# Patient Record
Sex: Male | Born: 1955 | Race: White | Hispanic: No | Marital: Married | Smoking: Former smoker
Health system: Southern US, Community
[De-identification: ages and names within clinical notes are randomized; demographics above are authoritative.]

## PROBLEM LIST (undated history)

## (undated) DIAGNOSIS — E119 Type 2 diabetes mellitus without complications: Secondary | ICD-10-CM

## (undated) DIAGNOSIS — K219 Gastro-esophageal reflux disease without esophagitis: Secondary | ICD-10-CM

## (undated) DIAGNOSIS — N289 Disorder of kidney and ureter, unspecified: Secondary | ICD-10-CM

## (undated) DIAGNOSIS — E7800 Pure hypercholesterolemia, unspecified: Secondary | ICD-10-CM

## (undated) DIAGNOSIS — I10 Essential (primary) hypertension: Secondary | ICD-10-CM

## (undated) HISTORY — PX: ANTERIOR CERVICAL DISCECTOMY: SHX1160

## (undated) HISTORY — PX: SURGERY SCROTAL / TESTICULAR: SUR1316

## (undated) HISTORY — PX: OTHER SURGICAL HISTORY: SHX169

## (undated) HISTORY — PX: RADICAL ORCHIECTOMY: SHX2285

## (undated) HISTORY — PX: FLEXOR TENDON OF FOREARM / WRIST REPAIR: SHX1650

## (undated) HISTORY — PX: NECK SURGERY: SHX720

## (undated) HISTORY — PX: CYST EXCISION: SHX5701

## (undated) HISTORY — PX: LITHOTRIPSY: SUR834

---

## 2003-02-16 ENCOUNTER — Encounter: Payer: Self-pay | Admitting: Neurosurgery

## 2003-02-20 ENCOUNTER — Encounter: Payer: Self-pay | Admitting: Neurosurgery

## 2003-02-20 ENCOUNTER — Observation Stay (HOSPITAL_COMMUNITY): Admission: RE | Admit: 2003-02-20 | Discharge: 2003-02-21 | Payer: Self-pay | Admitting: Neurosurgery

## 2014-04-01 DIAGNOSIS — I10 Essential (primary) hypertension: Secondary | ICD-10-CM | POA: Insufficient documentation

## 2015-02-25 ENCOUNTER — Encounter: Payer: Self-pay | Admitting: Internal Medicine

## 2015-02-25 NOTE — Telephone Encounter (Signed)
This encounter was created in error - please disregard.

## 2015-03-19 DIAGNOSIS — E1165 Type 2 diabetes mellitus with hyperglycemia: Secondary | ICD-10-CM | POA: Insufficient documentation

## 2018-06-27 ENCOUNTER — Encounter (HOSPITAL_COMMUNITY): Payer: Self-pay

## 2018-06-27 ENCOUNTER — Emergency Department (HOSPITAL_COMMUNITY)
Admission: EM | Admit: 2018-06-27 | Discharge: 2018-06-27 | Disposition: A | Discharge status: Home / Self Care | Payer: 59 | Attending: Emergency Medicine | Admitting: Emergency Medicine

## 2018-06-27 ENCOUNTER — Emergency Department (HOSPITAL_COMMUNITY): Payer: 59

## 2018-06-27 ENCOUNTER — Other Ambulatory Visit: Payer: Self-pay

## 2018-06-27 DIAGNOSIS — I10 Essential (primary) hypertension: Secondary | ICD-10-CM | POA: Diagnosis not present

## 2018-06-27 DIAGNOSIS — Z87891 Personal history of nicotine dependence: Secondary | ICD-10-CM | POA: Insufficient documentation

## 2018-06-27 DIAGNOSIS — R55 Syncope and collapse: Secondary | ICD-10-CM | POA: Insufficient documentation

## 2018-06-27 DIAGNOSIS — Z79899 Other long term (current) drug therapy: Secondary | ICD-10-CM | POA: Insufficient documentation

## 2018-06-27 DIAGNOSIS — E119 Type 2 diabetes mellitus without complications: Secondary | ICD-10-CM | POA: Diagnosis not present

## 2018-06-27 DIAGNOSIS — Z7984 Long term (current) use of oral hypoglycemic drugs: Secondary | ICD-10-CM | POA: Diagnosis not present

## 2018-06-27 DIAGNOSIS — Z7982 Long term (current) use of aspirin: Secondary | ICD-10-CM | POA: Diagnosis not present

## 2018-06-27 HISTORY — DX: Essential (primary) hypertension: I10

## 2018-06-27 HISTORY — DX: Gastro-esophageal reflux disease without esophagitis: K21.9

## 2018-06-27 HISTORY — DX: Pure hypercholesterolemia, unspecified: E78.00

## 2018-06-27 HISTORY — DX: Disorder of kidney and ureter, unspecified: N28.9

## 2018-06-27 HISTORY — DX: Type 2 diabetes mellitus without complications: E11.9

## 2018-06-27 LAB — COMPREHENSIVE METABOLIC PANEL
ALBUMIN: 4.6 g/dL (ref 3.5–5.0)
ALT: 28 U/L (ref 0–44)
AST: 33 U/L (ref 15–41)
Alkaline Phosphatase: 61 U/L (ref 38–126)
Anion gap: 16 — ABNORMAL HIGH (ref 5–15)
BUN: 23 mg/dL (ref 8–23)
CHLORIDE: 102 mmol/L (ref 98–111)
CO2: 25 mmol/L (ref 22–32)
Calcium: 10.1 mg/dL (ref 8.9–10.3)
Creatinine, Ser: 2.67 mg/dL — ABNORMAL HIGH (ref 0.61–1.24)
GFR calc Af Amer: 28 mL/min — ABNORMAL LOW (ref >60)
GFR calc non Af Amer: 24 mL/min — ABNORMAL LOW (ref >60)
GLUCOSE: 174 mg/dL — AB (ref 70–99)
Potassium: 3.4 mmol/L — ABNORMAL LOW (ref 3.5–5.1)
SODIUM: 143 mmol/L (ref 135–145)
Total Bilirubin: 1.1 mg/dL (ref 0.3–1.2)
Total Protein: 7.9 g/dL (ref 6.5–8.1)

## 2018-06-27 LAB — CBC WITH DIFFERENTIAL/PLATELET
Basophils Absolute: 0 10*3/uL (ref 0.0–0.1)
Basophils Relative: 0 %
EOS PCT: 0 %
Eosinophils Absolute: 0 10*3/uL (ref 0.0–0.7)
HCT: 40.6 % (ref 39.0–52.0)
Hemoglobin: 14 g/dL (ref 13.0–17.0)
LYMPHS ABS: 1.4 10*3/uL (ref 0.7–4.0)
LYMPHS PCT: 15 %
MCH: 30.2 pg (ref 26.0–34.0)
MCHC: 34.5 g/dL (ref 30.0–36.0)
MCV: 87.5 fL (ref 78.0–100.0)
MONO ABS: 0.5 10*3/uL (ref 0.1–1.0)
MONOS PCT: 5 %
Neutro Abs: 7.7 10*3/uL (ref 1.7–7.7)
Neutrophils Relative %: 80 %
PLATELETS: 292 10*3/uL (ref 150–400)
RBC: 4.64 MIL/uL (ref 4.22–5.81)
RDW: 12.6 % (ref 11.5–15.5)
WBC: 9.6 10*3/uL (ref 4.0–10.5)

## 2018-06-27 LAB — TROPONIN I

## 2018-06-27 MED ORDER — SODIUM CHLORIDE 0.9 % IV BOLUS (SEPSIS)
1000.0000 mL | Freq: Once | INTRAVENOUS | Status: AC
  Administered 2018-06-27: 1000 mL via INTRAVENOUS

## 2018-06-27 MED ORDER — ONDANSETRON HCL 4 MG/2ML IJ SOLN
4.0000 mg | Freq: Once | INTRAMUSCULAR | Status: AC
  Administered 2018-06-27: 4 mg via INTRAVENOUS
  Filled 2018-06-27: qty 2

## 2018-06-27 NOTE — ED Notes (Signed)
No new complaints. Pain free. Family to bedside. Awaiting disposition. Vital signs stable.

## 2018-06-27 NOTE — ED Triage Notes (Signed)
Pt reports was playing golf and started feeling like he had to have a bm.  Says pain "hit me hard."  Reports had felt dizzy prior to the pain.  Pt says he went to the restroom and nearly passed out.  EMS arrived and reports pt was diaphoretic 88/64 cbg 188, and hr 89.  EMS put ice pack on patient.  PT alert and oriented.  Pt vomited x 4 with ems.  EMS started IV and bolus given.   Pt denies pain but reports generalized weakness and nausea.  Pt also had episode of diarrhea.  Reports had been constipated prior to today.

## 2018-06-27 NOTE — ED Notes (Signed)
Pt awake,alert and oriented x 3. Skin warm and dry. Speech clear. Pt ambulatory with observed steady gait. Family driving. No new complaints or concerns verbalized at time of discharge.

## 2018-06-27 NOTE — Discharge Instructions (Addendum)
Drink plenty of fluids.  Follow-up with your family doctor this week.  Give him the lab test results we have

## 2018-06-28 NOTE — ED Provider Notes (Signed)
[facility] EMERGENCY DEPARTMENT Provider Note   CSN: [id number] Arrival date & time: 06/27/18  1542     History   Chief Complaint Chief Complaint  Patient presents with  . Loss of Consciousness    HPI Ernest Bullock is a 62 y.o. male.  Patient states he was playing golf all day out in the heat.  After he finished the last hole he felt like he needed to go to the restroom.  He had a bowel movement but became very diaphoretic and passed out in the bathroom.  Patient just feels weak.  No pain  The history is provided by the patient. No language interpreter was used.  Loss of Consciousness   This is a new problem. The current episode started less than 1 hour ago. The problem occurs rarely. The problem has been resolved. He lost consciousness for a period of less than one minute. Associated with: Having bowel movement. Associated symptoms include dizziness and visual change. Pertinent negatives include abdominal pain, back pain, chest pain, congestion, headaches and seizures. He has tried nothing for the symptoms. The treatment provided no relief. His past medical history does not include CVA.    Past Medical History:  Diagnosis Date  . Acid reflux   . Diabetes mellitus without complication (HCC)   . Hypercholesterolemia   . Hypertension   . Renal disorder    kidney stone    There are no active problems to display for this patient.   Past Surgical History:  Procedure Laterality Date  . arm surgery    . cyst removed from chest    . LITHOTRIPSY    . NECK SURGERY    . SURGERY SCROTAL / TESTICULAR          Home Medications    Prior to Admission medications   Medication Sig Start Date End Date Taking? Authorizing Provider  amLODipine (NORVASC) 5 MG tablet Take 5 mg by mouth daily.  04/27/17  Yes [provider]  aspirin EC 81 MG tablet Take 81 mg by mouth daily.    Yes [provider]  fenofibrate 160 MG tablet Take 160 mg by mouth daily.  04/27/17   Yes [provider]  losartan-hydrochlorothiazide (HYZAAR) 100-25 MG tablet Take 1 tablet by mouth daily.  04/27/17  Yes [provider]  metFORMIN (GLUCOPHAGE) 500 MG tablet Take 500 mg by mouth 2 (two) times daily with a meal.  05/24/17  Yes [provider]  pioglitazone (ACTOS) 30 MG tablet Take 30 mg by mouth daily.  04/27/17 12/30/18 Yes [provider]  simvastatin (ZOCOR) 40 MG tablet Take 40 mg by mouth daily at 6 PM.  04/27/17  Yes [provider]    Family History No family history on file.  Social History Social History   Tobacco Use  . Smoking status: Former Games developer  . Smokeless tobacco: Never Used  Substance Use Topics  . Alcohol use: Not on file    Comment: occ  . Drug use: Never     Allergies   Ezetimibe and Rosuvastatin   Review of Systems Review of Systems  Constitutional: Negative for appetite change and fatigue.  HENT: Negative for congestion, ear discharge and sinus pressure.   Eyes: Negative for discharge.  Respiratory: Negative for cough.   Cardiovascular: Positive for syncope. Negative for chest pain.  Gastrointestinal: Negative for abdominal pain and diarrhea.  Genitourinary: Negative for frequency and hematuria.  Musculoskeletal: Negative for back pain.  Skin: Negative for rash.  Neurological: Positive for dizziness. Negative for seizures and headaches.  Psychiatric/Behavioral: Negative for hallucinations.     Physical Exam Updated Vital Signs BP 124/78 (BP Location: Right Arm)   Pulse (!) 103   Temp 97.7 F (36.5 C) (Oral)   Resp 16   Ht 5\' 10"  (1.778 m)   Wt 90.7 kg   SpO2 99%   BMI 28.70 kg/m   Physical Exam  Constitutional: He is oriented to person, place, and time. He appears well-developed.  HENT:  Head: Normocephalic.  Mucous membranes  Eyes: Conjunctivae and EOM are normal. No scleral icterus.  Neck: Neck supple. No thyromegaly present.  Cardiovascular: Normal rate and regular  rhythm. Exam reveals no gallop and no friction rub.  No murmur heard. Pulmonary/Chest: No stridor. He has no wheezes. He has no rales. He exhibits no tenderness.  Abdominal: He exhibits no distension. There is no tenderness. There is no rebound.  Musculoskeletal: Normal range of motion. He exhibits no edema.  Lymphadenopathy:    He has no cervical adenopathy.  Neurological: He is oriented to person, place, and time. He exhibits normal muscle tone. Coordination normal.  Skin: No rash noted. No erythema.  Psychiatric: He has a normal mood and affect. His behavior is normal.     ED Treatments / Results  Labs (all labs ordered are listed, but only abnormal results are displayed) Labs Reviewed  COMPREHENSIVE METABOLIC PANEL - Abnormal; Notable for the following components:      Result Value   Potassium 3.4 (*)    Glucose, Bld 174 (*)    Creatinine, Ser 2.67 (*)    GFR calc non Af Amer 24 (*)    GFR calc Af Amer 28 (*)    Anion gap 16 (*)    All other components within normal limits  CBC WITH DIFFERENTIAL/PLATELET  TROPONIN I    EKG EKG Interpretation  Date/Time:  Monday June 27 2018 16:03:44 EDT Ventricular Rate:  83 PR Interval:    QRS Duration: 116 QT Interval:  389 QTC Calculation: 458 R Axis:   19 Text Interpretation:  Sinus rhythm Incomplete right bundle branch block Confirmed by Bethann Berkshire [phone]) on 06/27/2018 10:44:32 PM   Radiology Dg Abd Acute W/chest  Result Date: 06/27/2018 CLINICAL DATA:  Nausea, vomiting, weakness and dizziness. EXAM: DG ABDOMEN ACUTE W/ 1V CHEST COMPARISON:  None. FINDINGS: Lungs are clear. Cardiomediastinal silhouette is within normal. Fusion hardware intact over the lower cervical spine. Mild degenerative change of the spine. Bowel gas pattern is nonobstructive. No free peritoneal air. Paucity of bowel gas is present. No mass or abnormal calcifications over the abdomen. Phlebolith over the right pelvis. Minimal degenerate change of  the spine. IMPRESSION: Nonobstructive bowel gas pattern. No acute cardiopulmonary disease. Electronically Signed   By: Elberta Fortis M.D.   On: 06/27/2018 17:22    Procedures Procedures (including critical care time)  Medications Ordered in ED Medications  sodium chloride 0.9 % bolus 1,000 mL (0 mLs Intravenous Stopped 06/27/18 1719)  ondansetron (ZOFRAN) injection 4 mg (4 mg Intravenous Given 06/27/18 1620)     Initial Impression / Assessment and Plan / ED Course  I have reviewed the triage vital signs and the nursing notes.  Pertinent labs & imaging results that were available during my care of the patient were reviewed by me and considered in my medical decision making (see chart for details).     Labs unremarkable except for elevated creatinine.  Patient had states he has a history of  this.  Patient improved with 2 L of normal saline.  Suspect heat exhaustion.  Patient will follow up with his PCP this week  Final Clinical Impressions(s) / ED Diagnoses   Final diagnoses:  Syncope and collapse    ED Discharge Orders    None       Bethann BerkshireZammit, Shanera Meske, MD 06/28/18 1642

## 2021-12-26 ENCOUNTER — Emergency Department (HOSPITAL_BASED_OUTPATIENT_CLINIC_OR_DEPARTMENT_OTHER)
Admission: EM | Admit: 2021-12-26 | Discharge: 2021-12-27 | Disposition: A | Discharge status: Home / Self Care | Payer: Medicare HMO | Attending: Emergency Medicine | Admitting: Emergency Medicine

## 2021-12-26 ENCOUNTER — Other Ambulatory Visit: Payer: Self-pay

## 2021-12-26 ENCOUNTER — Emergency Department (HOSPITAL_BASED_OUTPATIENT_CLINIC_OR_DEPARTMENT_OTHER): Payer: Medicare HMO

## 2021-12-26 ENCOUNTER — Encounter (HOSPITAL_BASED_OUTPATIENT_CLINIC_OR_DEPARTMENT_OTHER): Payer: Self-pay

## 2021-12-26 DIAGNOSIS — E119 Type 2 diabetes mellitus without complications: Secondary | ICD-10-CM | POA: Diagnosis not present

## 2021-12-26 DIAGNOSIS — W010XXA Fall on same level from slipping, tripping and stumbling without subsequent striking against object, initial encounter: Secondary | ICD-10-CM | POA: Diagnosis not present

## 2021-12-26 DIAGNOSIS — Z79899 Other long term (current) drug therapy: Secondary | ICD-10-CM | POA: Diagnosis not present

## 2021-12-26 DIAGNOSIS — S6991XA Unspecified injury of right wrist, hand and finger(s), initial encounter: Secondary | ICD-10-CM | POA: Diagnosis present

## 2021-12-26 DIAGNOSIS — Y93K1 Activity, walking an animal: Secondary | ICD-10-CM | POA: Diagnosis not present

## 2021-12-26 DIAGNOSIS — I10 Essential (primary) hypertension: Secondary | ICD-10-CM | POA: Insufficient documentation

## 2021-12-26 DIAGNOSIS — Z7984 Long term (current) use of oral hypoglycemic drugs: Secondary | ICD-10-CM | POA: Diagnosis not present

## 2021-12-26 DIAGNOSIS — Z87891 Personal history of nicotine dependence: Secondary | ICD-10-CM | POA: Diagnosis not present

## 2021-12-26 DIAGNOSIS — S80211A Abrasion, right knee, initial encounter: Secondary | ICD-10-CM

## 2021-12-26 DIAGNOSIS — S50311A Abrasion of right elbow, initial encounter: Secondary | ICD-10-CM | POA: Diagnosis not present

## 2021-12-26 DIAGNOSIS — Z23 Encounter for immunization: Secondary | ICD-10-CM | POA: Diagnosis not present

## 2021-12-26 DIAGNOSIS — M25531 Pain in right wrist: Secondary | ICD-10-CM | POA: Diagnosis not present

## 2021-12-26 DIAGNOSIS — W19XXXA Unspecified fall, initial encounter: Secondary | ICD-10-CM

## 2021-12-26 DIAGNOSIS — Z7982 Long term (current) use of aspirin: Secondary | ICD-10-CM | POA: Diagnosis not present

## 2021-12-26 DIAGNOSIS — S62011A Displaced fracture of distal pole of navicular [scaphoid] bone of right wrist, initial encounter for closed fracture: Secondary | ICD-10-CM | POA: Diagnosis not present

## 2021-12-26 DIAGNOSIS — W548XXA Other contact with dog, initial encounter: Secondary | ICD-10-CM

## 2021-12-26 NOTE — ED Provider Notes (Signed)
? ?[facility] ?Provider Note: Lowella Dell, MD, FACEP ? ?CSN: [id number] ?MRN: [id number] ?ARRIVAL: 12/26/21 at 2156 ?ROOM: [facility] ? ? ?CHIEF COMPLAINT  ?Arm Injury ? ? ?HISTORY OF PRESENT ILLNESS  ?12/26/21 11:56 PM ?Ernest Bullock is a 66 y.o. male who fell about 7:30 PM while walking a dog.  The dog turned and caused him to trip and fall.  He injured his right wrist and elbow.  The pain in his wrist is not severe but worse with movement of his thumb which causes it to radiate proximally to his elbow.  He has superficial abrasions to his right elbow and right knee.  He denies neck pain.  He is not sure of his last tetanus shot. ? ? ?Past Medical History:  ?Diagnosis Date  ? Acid reflux   ? Diabetes mellitus without complication (HCC)   ? Hypercholesterolemia   ? Hypertension   ? Renal disorder   ? kidney stone  ? ? ?Past Surgical History:  ?Procedure Laterality Date  ? arm surgery    ? cyst removed from chest    ? LITHOTRIPSY    ? NECK SURGERY    ? SURGERY SCROTAL / TESTICULAR    ? ? ?No family history on file. ? ?Social History  ? ?Tobacco Use  ? Smoking status: Former  ?  Types: Cigarettes  ? Smokeless tobacco: Never  ?Vaping Use  ? Vaping Use: Every day  ?Substance Use Topics  ? Alcohol use: Yes  ?  Comment: occ  ? Drug use: Never  ? ? ?Prior to Admission medications   ?Medication Sig Start Date End Date Taking? Authorizing Provider  ?amLODipine (NORVASC) 5 MG tablet Take 5 mg by mouth daily.  04/27/17   [provider]  ?aspirin EC 81 MG tablet Take 81 mg by mouth daily.     [provider]  ?fenofibrate 160 MG tablet Take 160 mg by mouth daily.  04/27/17   [provider]  ?losartan-hydrochlorothiazide (HYZAAR) 100-25 MG tablet Take 1 tablet by mouth daily.  04/27/17   [provider]  ?metFORMIN (GLUCOPHAGE) 500 MG tablet Take 500 mg by mouth 2 (two) times daily with a meal.  05/24/17   [provider]  ?pioglitazone (ACTOS) 30 MG tablet Take 30 mg  by mouth daily.  04/27/17 12/30/18  [provider]  ?simvastatin (ZOCOR) 40 MG tablet Take 40 mg by mouth daily at 6 PM.  04/27/17   [provider]  ? ? ?Allergies ?Ezetimibe and Rosuvastatin ? ? ?REVIEW OF SYSTEMS  ?Negative except as noted here or in the History of Present Illness. ? ? ?PHYSICAL EXAMINATION  ?Initial Vital Signs ?Blood pressure (!) 150/81, pulse (!) 107, temperature 98.5 ?F (36.9 ?C), temperature source Oral, resp. rate 18, height 5\' 10"  (1.778 m), weight 84.8 kg, SpO2 98 %. ? ?Examination ?General: Well-developed, well-nourished male in no acute distress; appearance consistent with age of record ?HENT: normocephalic; atraumatic ?Eyes: Normal appearance ?Neck: supple; nontender ?Heart: regular rate and rhythm ?Lungs: clear to auscultation bilaterally ?Abdomen: soft; nondistended; nontender; bowel sounds present ?Extremities: No deformity; pain on movement of right thumb with mild right snuffbox tenderness ?Neurologic: Awake, alert and oriented; motor function intact in all extremities and symmetric; no facial droop ?Skin: Warm and dry; superficial abrasions of right knee and right elbow ?Psychiatric: Normal mood and affect ? ? ?RESULTS  ?Summary of this visit's results, reviewed and interpreted by myself: ? ? EKG Interpretation ? ?Date/Time:    ?  Ventricular Rate:    ?PR Interval:    ?QRS Duration:   ?QT Interval:    ?QTC Calculation:   ?R Axis:     ?Text Interpretation:   ?  ? ?  ? ?Laboratory Studies: ?No results found for this or any previous visit (from the past 24 hour(s)). ?Imaging Studies: ?DG Elbow Complete Right ? ?Result Date: 12/26/2021 ?CLINICAL DATA:  Status post fall. EXAM: RIGHT ELBOW - COMPLETE 3+ VIEW COMPARISON:  None. FINDINGS: There is no evidence of fracture, dislocation, or joint effusion. Mild bony spurring is seen along the olecranon process. Soft tissues are unremarkable. IMPRESSION: No acute osseous abnormality. Electronically Signed   By: Aram Candela  M.D.   On: 12/26/2021 22:58  ? ?DG Wrist Complete Right ? ?Result Date: 12/26/2021 ?CLINICAL DATA:  Status post fall. EXAM: RIGHT WRIST - COMPLETE 3+ VIEW COMPARISON:  None. FINDINGS: A small, mildly displaced fracture of the distal aspect of the right scaphoid bone is noted. There is no evidence of dislocation. There is no evidence of arthropathy or other focal bone abnormality. Soft tissues are unremarkable. IMPRESSION: Small, mildly displaced fracture of the distal right scaphoid bone. Electronically Signed   By: Aram Candela M.D.   On: 12/26/2021 23:00   ? ?ED COURSE and MDM  ?Nursing notes, initial and subsequent vitals signs, including pulse oximetry, reviewed and interpreted by myself. ? ?Vitals:  ? 12/26/21 2208 12/26/21 2209  ?BP:  (!) 150/81  ?Pulse:  (!) 107  ?Resp:  18  ?Temp:  98.5 ?F (36.9 ?C)  ?TempSrc:  Oral  ?SpO2:  98%  ?Weight: 84.8 kg   ?Height: 5\' 10"  (1.778 m)   ? ?Medications  ?Tdap (BOOSTRIX) injection 0.5 mL (has no administration in time range)  ? ? ?We will place in a thumb spica splint as the extent of the scaphoid fracture could be more severe than what is visible on the radiograph.  We will have him follow-up with hand surgery. ? ?PROCEDURES  ?Procedures ? ? ?ED DIAGNOSES  ? ?  ICD-10-CM   ?1. Closed displaced fracture of distal pole of scaphoid bone of right wrist, initial encounter  S62.011A   ?  ?2. Fall, initial encounter  W19.04-05-1974   ?  ?3. Other contact with dog, initial encounter  W54.8XXA   ?  ?4. Abrasion of right elbow, initial encounter  S50.311A   ?  ?5. Abrasion of right knee, initial encounter  S80.211A   ?  ? ? ? ?  ?10-08-1973, MD ?12/27/21 0007 ? ?

## 2021-12-26 NOTE — ED Triage Notes (Signed)
Pt states he fell/injured right UE ~730pm-pain to right wrist and elbow-NAD-steady gait ?

## 2021-12-27 MED ORDER — TETANUS-DIPHTH-ACELL PERTUSSIS 5-2.5-18.5 LF-MCG/0.5 IM SUSY
0.5000 mL | PREFILLED_SYRINGE | Freq: Once | INTRAMUSCULAR | Status: AC
  Administered 2021-12-27: 0.5 mL via INTRAMUSCULAR
  Filled 2021-12-27: qty 0.5

## 2023-06-20 IMAGING — DX DG ELBOW COMPLETE 3+V*R*
4 series · 4 of 4 positions shown · non-contrast
Comparison: None.

CLINICAL DATA: Status post fall.

EXAM:
RIGHT ELBOW - COMPLETE 3+ VIEW

[elbow ap]
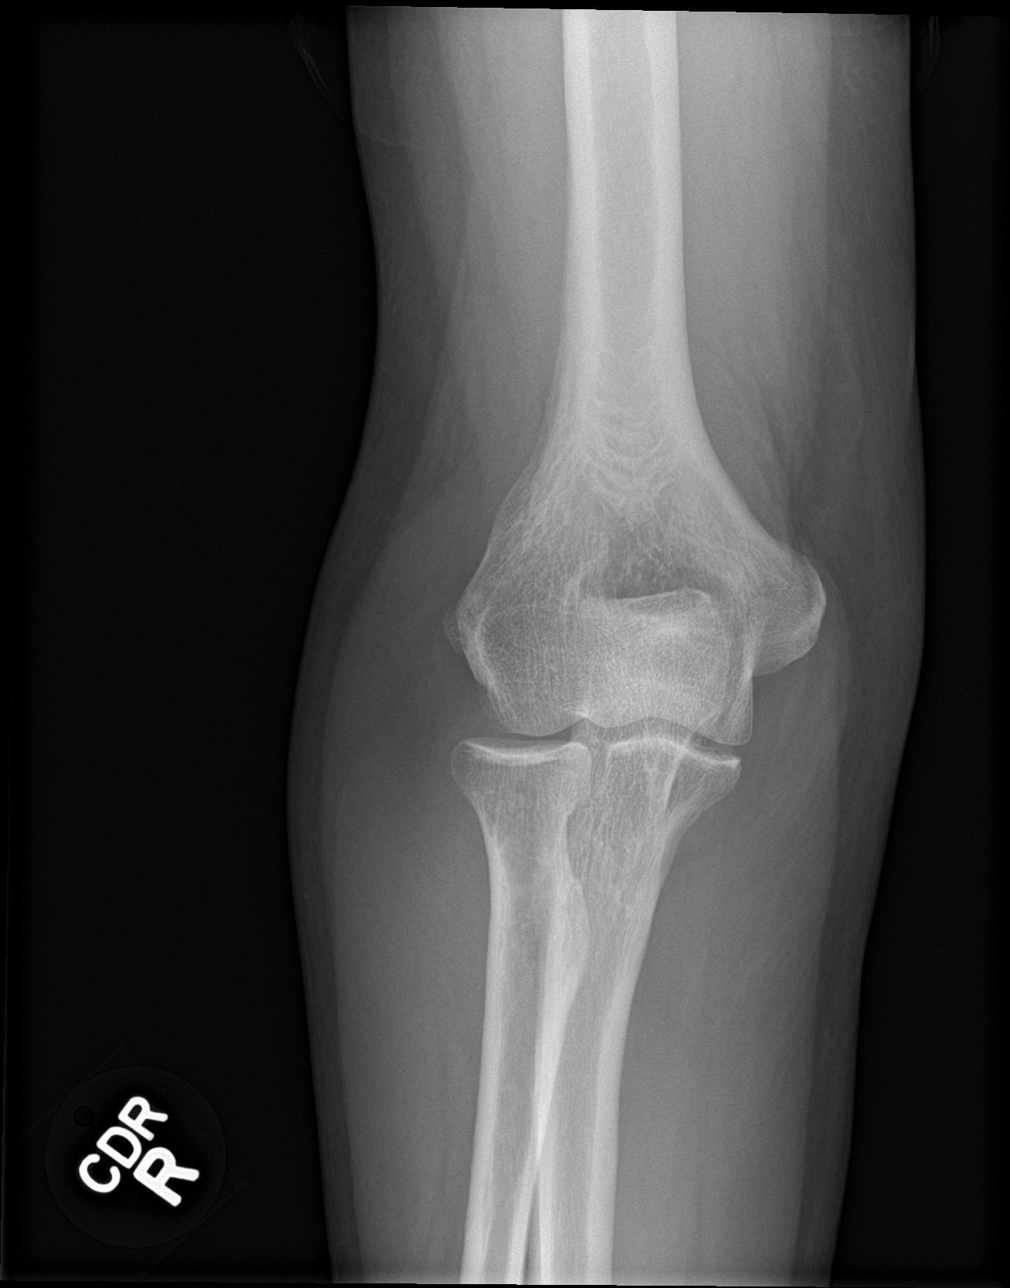

[elbow obl (1 of 2)]
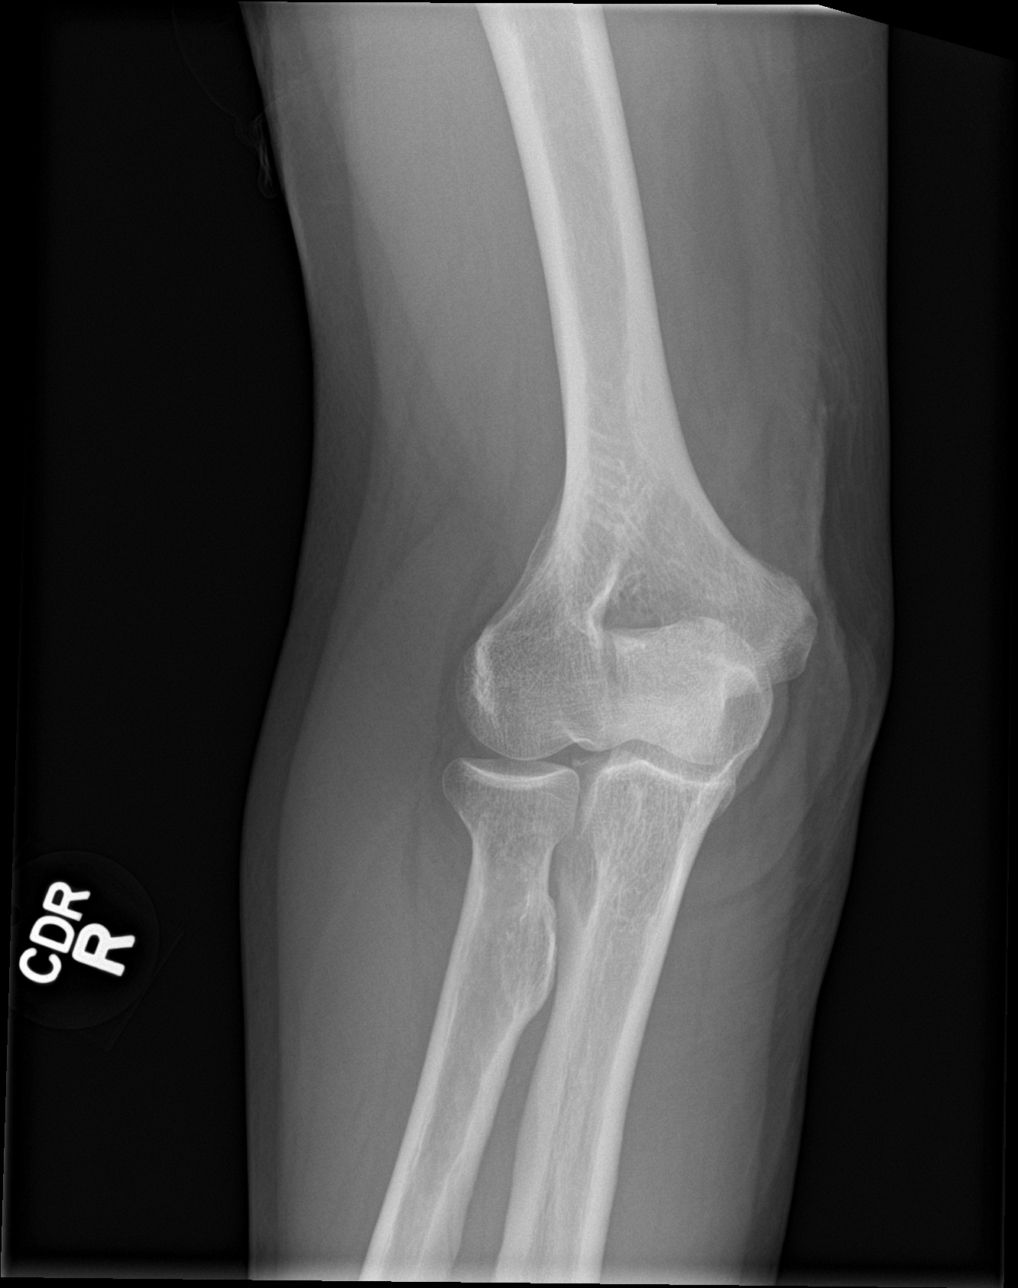

[elbow obl (2 of 2)]
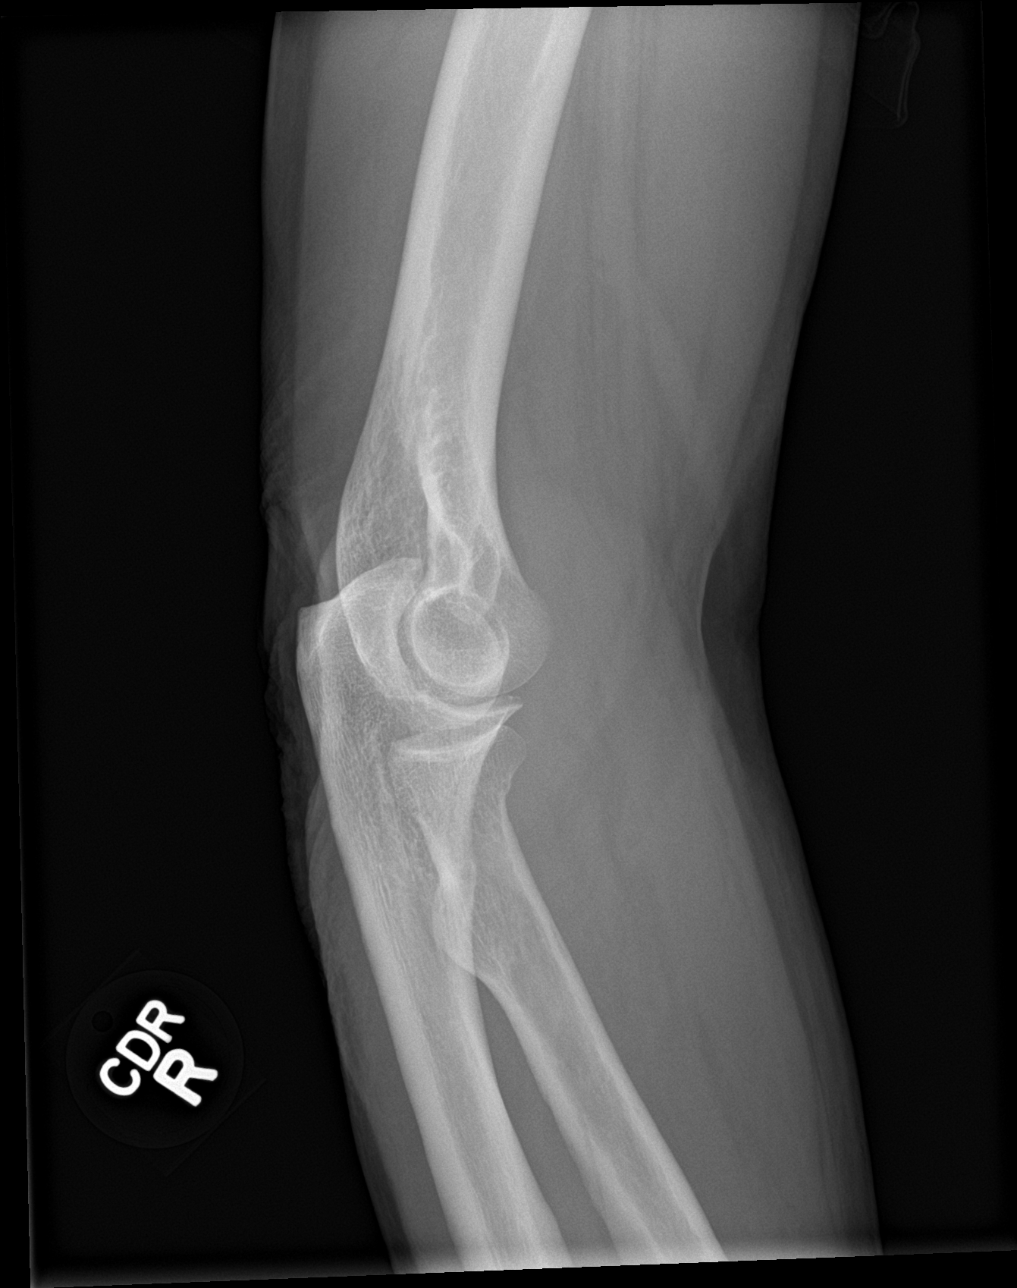

[elbow lat]
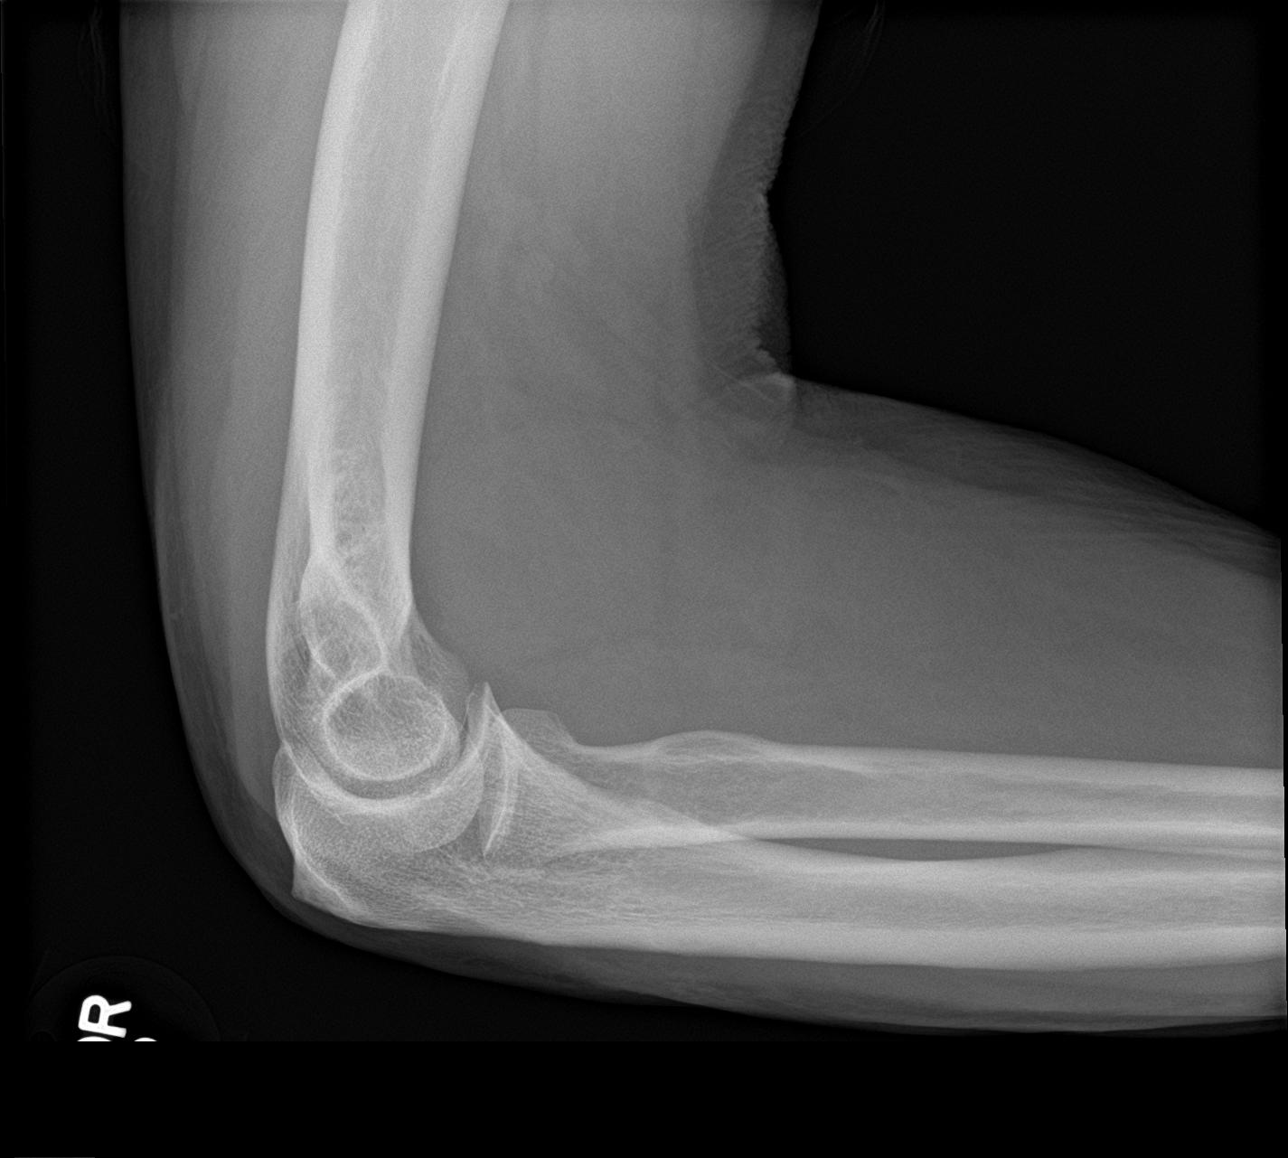

[4 of 4 positions shown; findings below may reference images not displayed]

FINDINGS: There is no evidence of fracture, dislocation, or joint effusion.
Mild bony spurring is seen along the olecranon process. Soft tissues
are unremarkable.
IMPRESSION: No acute osseous abnormality.

## 2023-11-24 ENCOUNTER — Encounter: Payer: Self-pay | Admitting: Family Medicine

## 2023-11-24 ENCOUNTER — Ambulatory Visit (INDEPENDENT_AMBULATORY_CARE_PROVIDER_SITE_OTHER): Payer: Medicare (Managed Care) | Admitting: Family Medicine

## 2023-11-24 VITALS — BP 122/80 | HR 72 | Temp 97.6°F | Ht 70.0 in | Wt 189.0 lb

## 2023-11-24 DIAGNOSIS — E785 Hyperlipidemia, unspecified: Secondary | ICD-10-CM | POA: Diagnosis not present

## 2023-11-24 DIAGNOSIS — Z7984 Long term (current) use of oral hypoglycemic drugs: Secondary | ICD-10-CM

## 2023-11-24 DIAGNOSIS — I10 Essential (primary) hypertension: Secondary | ICD-10-CM

## 2023-11-24 DIAGNOSIS — Z23 Encounter for immunization: Secondary | ICD-10-CM | POA: Diagnosis not present

## 2023-11-24 DIAGNOSIS — Z720 Tobacco use: Secondary | ICD-10-CM

## 2023-11-24 DIAGNOSIS — D649 Anemia, unspecified: Secondary | ICD-10-CM

## 2023-11-24 DIAGNOSIS — E1165 Type 2 diabetes mellitus with hyperglycemia: Secondary | ICD-10-CM

## 2023-11-24 MED ORDER — METFORMIN HCL 500 MG PO TABS: 500.0000 mg | ORAL_TABLET | Freq: Two times a day (BID) | ORAL | 1 refills | Status: AC

## 2023-11-24 MED ORDER — AMLODIPINE BESYLATE 5 MG PO TABS: 5.0000 mg | ORAL_TABLET | Freq: Every day | ORAL | 1 refills | Status: AC

## 2023-11-24 MED ORDER — FENOFIBRATE 160 MG PO TABS: 160.0000 mg | ORAL_TABLET | Freq: Every day | ORAL | 1 refills | Status: DC

## 2023-11-24 MED ORDER — LOSARTAN POTASSIUM-HCTZ 100-25 MG PO TABS: 1.0000 | ORAL_TABLET | Freq: Every day | ORAL | 1 refills | Status: AC

## 2023-11-24 MED ORDER — SIMVASTATIN 40 MG PO TABS: 40.0000 mg | ORAL_TABLET | Freq: Every day | ORAL | 1 refills | Status: AC

## 2023-11-24 NOTE — Progress Notes (Signed)
 Patient Office Visit  Assessment & Plan:  Benign essential hypertension -     COMPLETE METABOLIC PANEL WITH GFR -     amLODIPine Besylate; Take 1 tablet (5 mg total) by mouth daily.  Dispense: 90 tablet; Refill: 1 -     Losartan Potassium-HCTZ; Take 1 tablet by mouth daily.  Dispense: 90 tablet; Refill: 1  Anemia, unspecified type  Hyperlipidemia, unspecified hyperlipidemia type -     Lipid panel -     Fenofibrate; Take 1 tablet (160 mg total) by mouth daily.  Dispense: 90 tablet; Refill: 1 -     Simvastatin; Take 1 tablet (40 mg total) by mouth daily at 6 PM.  Dispense: 90 tablet; Refill: 1  Uncontrolled type 2 diabetes mellitus with hyperglycemia (HCC) -     Hemoglobin A1c -     metFORMIN HCl; Take 1 tablet (500 mg total) by mouth 2 (two) times daily with a meal.  Dispense: 180 tablet; Refill: 1  Need for pneumococcal vaccination -     Pneumococcal Conjugate PCV21(Capvaxive)  Current every day nicotine vaping   Test results were reviewed and analyzed as part of the medical decision making of this visit.  Reviewed previous plate and family medicine notes, reviewed Dr. Ottis Stain hematologist in [facility] notes and lab work during the office visit.  Pneumococcal 21 given today.  Follow-up on lab work and notify patient and adjust medications if necessary.  If his potassium is low we could reduce the diuretic dosage.  Return in 3 to 4 months or sooner if necessary.  Recommend cessation of vaping. Return in about 4 months (around 03/23/2024), or if symptoms worsen or fail to improve.   Subjective:    Patient ID: Ernest Bullock, male    DOB: 15-Nov-1955  Age: 68 y.o. MRN: [id number]  Chief Complaint  Patient presents with   Establish Care   Medical Management of Chronic Issues    HPI Type 2 diabetes-previously controlled.  Denies diarrhea, peripheral swelling, hypoglycemia, excessive thirst, excessive urination, visual fluctuation, and fatigue.  Making an effort on diet control and  exercise.  Does not have an up-to-date dilated eye exam.   Patient's weight remains stable.  Patient does eat healthy for the most part and stays active. Pt has been taking 2 metformin's per day not 3 as originally prescribed.  Patient is tolerating medication without side effects.  Patient does not take Actos.  Patient does not have an up-to-date eye exam. Hyperlipidemia-denies unusual muscle aches or muscle cramps or difficulty tolerating statin therapy.  Aware of need for diet control, exercise and healthy eating.  Patient is aware not to consume grapefruit juice or pomegranate juice. HTN-using antihypertensive medication without difficulty.  Denies associated signs and symptoms including chest pain, shortness of breath, cough headache, peripheral swelling cramps spasms and palpitations.  Voices understanding of the potential for interference with blood pressure control with substances including high sodium intake, decongestions, herbal supplements weight loss supplements nutritional supplements.  Blood pressures at home are less than 140/90.   Patient has  had normal potassium.  Patient ran out of amlodipine a few weeks ago.  Patient has been trying to eat healthy overall but is not in a formal exercise program.  Patient does continue to vape on a daily basis. Anemia-patient has up-to-date colonoscopy.  Patient did see hematology Dr. Ottis Stain in [facility] and more labs were done.  B12 was a little low so now he is taking over-the-counter iron and vitamin B12.  Patient did have an SPEP which was negative for multiple myeloma.  Patient will have repeat labs in the next 3 to 4 months.  If anemia does not improve patient may need to have a bone marrow biopsy.  The 10-year ASCVD risk score (Arnett DK, et al., 2019) is: 29.8%  Past Medical History:  Diagnosis Date   Acid reflux    Diabetes mellitus without complication (HCC)    Hypercholesterolemia    Hypertension    Renal disorder    kidney stone   Past  Surgical History:  Procedure Laterality Date   ANTERIOR CERVICAL DISCECTOMY     CYST EXCISION     Chest   FLEXOR TENDON OF FOREARM / WRIST REPAIR Right    LITHOTRIPSY     RADICAL ORCHIECTOMY Left    1971   SURGERY SCROTAL / TESTICULAR Left    as a child in the late 60's/70's   Social History   Tobacco Use   Smoking status: Former    Types: Cigarettes   Smokeless tobacco: Never  Vaping Use   Vaping status: Every Day  Substance Use Topics   Alcohol use: Yes    Comment: occ   Drug use: Never   Family History  Problem Relation Age of Onset   Esophageal cancer Brother    Prostate cancer Neg Hx    Colon cancer Neg Hx    Stroke Neg Hx    Parkinson's disease Neg Hx    Heart disease Neg Hx    Dementia Neg Hx     Allergies  Allergen Reactions   Ezetimibe Nausea Only   Rosuvastatin Other (See Comments)    ROS    Objective:    BP 122/80   Pulse 72   Temp 97.6 F (36.4 C)   Ht 5\' 10"  (1.778 m)   Wt 189 lb (85.7 kg)   SpO2 98%   BMI 27.12 kg/m  BP Readings from Last 3 Encounters:  11/24/23 122/80  12/26/21 (!) 150/81  06/27/18 124/78   Wt Readings from Last 3 Encounters:  11/24/23 189 lb (85.7 kg)  12/26/21 187 lb (84.8 kg)  06/27/18 200 lb (90.7 kg)    Physical Exam Vitals and nursing note reviewed.  Constitutional:      General: He is not in acute distress.    Appearance: Normal appearance.     Comments: Patient comes in with his wife  HENT:     Head: Normocephalic.     Right Ear: Tympanic membrane, ear canal and external ear normal.     Left Ear: Tympanic membrane, ear canal and external ear normal.  Eyes:     Extraocular Movements: Extraocular movements intact.     Conjunctiva/sclera: Conjunctivae normal.     Pupils: Pupils are equal, round, and reactive to light.  Cardiovascular:     Rate and Rhythm: Normal rate and regular rhythm.     Heart sounds: Normal heart sounds.  Pulmonary:     Effort: Pulmonary effort is normal.     Breath sounds:  Normal breath sounds. No wheezing.  Musculoskeletal:     Right lower leg: No edema.     Left lower leg: No edema.  Neurological:     General: No focal deficit present.     Mental Status: He is alert and oriented to person, place, and time.  Psychiatric:        Mood and Affect: Mood normal.        Behavior: Behavior normal.  No results found for any visits on 11/24/23.

## 2023-11-25 ENCOUNTER — Telehealth: Payer: Self-pay | Admitting: Family Medicine

## 2023-11-25 LAB — COMPLETE METABOLIC PANEL WITH GFR
AG Ratio: 1.8 (calc) (ref 1.0–2.5)
ALT: 20 U/L (ref 9–46)
AST: 27 U/L (ref 10–35)
Albumin: 4.6 g/dL (ref 3.6–5.1)
Alkaline phosphatase (APISO): 35 U/L (ref 35–144)
BUN: 22 mg/dL (ref 7–25)
CO2: 31 mmol/L (ref 20–32)
Calcium: 10.2 mg/dL (ref 8.6–10.3)
Chloride: 99 mmol/L (ref 98–110)
Creat: 1.13 mg/dL (ref 0.70–1.35)
Globulin: 2.5 g/dL (ref 1.9–3.7)
Glucose, Bld: 112 mg/dL — ABNORMAL HIGH (ref 65–99)
Potassium: 3.9 mmol/L (ref 3.5–5.3)
Sodium: 139 mmol/L (ref 135–146)
Total Bilirubin: 0.8 mg/dL (ref 0.2–1.2)
Total Protein: 7.1 g/dL (ref 6.1–8.1)
eGFR: 71 mL/min/{1.73_m2} (ref >=60)

## 2023-11-25 LAB — HEMOGLOBIN A1C
Hgb A1c MFr Bld: 6.3 %{Hb} — ABNORMAL HIGH (ref <5.7)
Mean Plasma Glucose: 134 mg/dL
eAG (mmol/L): 7.4 mmol/L

## 2023-11-25 LAB — LIPID PANEL
Cholesterol: 175 mg/dL (ref <200)
HDL: 50 mg/dL (ref >=40)
LDL Cholesterol (Calc): 105 mg/dL — ABNORMAL HIGH
Non-HDL Cholesterol (Calc): 125 mg/dL (ref <130)
Total CHOL/HDL Ratio: 3.5 (calc) (ref <5.0)
Triglycerides: 105 mg/dL (ref <150)

## 2023-11-25 NOTE — Telephone Encounter (Unsigned)
 Copied from CRM [id number]. Topic: General - Other >> Nov 25, 2023  1:07 PM Gildardo Pounds wrote: Reason for CRM: Patient calling in to speak with Victorino Dike to give her some information after his appointment yesterday. Medicare number [id number]. Callback number is [phone]

## 2023-11-25 NOTE — Telephone Encounter (Signed)
 Spoke with patient to confirm I received the Medicare ID #.   Form successfully faxed to [employer] at [phone] with a confirmation time stamp of 11-25-2023 16:59.  As discussed, copy of forms and fax confirmation sheet mailed to patient's home address.

## 2023-11-26 ENCOUNTER — Encounter: Payer: Self-pay | Admitting: Family Medicine

## 2024-01-05 ENCOUNTER — Ambulatory Visit: Payer: 59 | Admitting: Family Medicine

## 2024-03-14 ENCOUNTER — Ambulatory Visit: Payer: PRIVATE HEALTH INSURANCE | Admitting: Family Medicine

## 2024-05-25 ENCOUNTER — Other Ambulatory Visit: Payer: Self-pay | Admitting: Family Medicine

## 2024-05-25 DIAGNOSIS — E785 Hyperlipidemia, unspecified: Secondary | ICD-10-CM

## 2024-05-26 NOTE — Telephone Encounter (Signed)
 Requested medications are due for refill today.  yes  Requested medications are on the active medications list.  yes  Last refill. 11/24/2023 #90 1 rf  Future visit scheduled.   no  Notes to clinic.  Pt has cancelled a few scheduled appts. Labs are expired.    Requested Prescriptions  Pending Prescriptions Disp Refills   fenofibrate  160 MG tablet [Pharmacy Med Name: FENOFIBRATE  160 MG TABLET] 90 tablet 1    Sig: TAKE 1 TABLET BY MOUTH DAILY     Cardiovascular:  Antilipid - Fibric Acid Derivatives Failed - 05/26/2024 10:23 AM      Failed - HGB in normal range and within 360 days    Hemoglobin  Date Value Ref Range Status  06/27/2018 14.0 13.0 - 17.0 g/dL Final         Failed - HCT in normal range and within 360 days    HCT  Date Value Ref Range Status  06/27/2018 40.6 39.0 - 52.0 % Final         Failed - PLT in normal range and within 360 days    Platelets  Date Value Ref Range Status  06/27/2018 292 150 - 400 K/uL Final         Failed - WBC in normal range and within 360 days    WBC  Date Value Ref Range Status  06/27/2018 9.6 4.0 - 10.5 K/uL Final         Failed - Lipid Panel in normal range within the last 12 months    Cholesterol  Date Value Ref Range Status  11/24/2023 175 <200 mg/dL Final   LDL Cholesterol (Calc)  Date Value Ref Range Status  11/24/2023 105 (H) mg/dL (calc) Final    Comment:    Reference range: <100 . Desirable range <100 mg/dL for primary prevention;   <70 mg/dL for patients with CHD or diabetic patients  with > or = 2 CHD risk factors. [facility]RA LDL-C is now calculated using the Martin-Hopkins  calculation, which is a validated novel method providing  better accuracy than the Friedewald equation in the  estimation of LDL-C.  Gladis APPLETHWAITE et al. SANDREA. 7986;689(80): 2061-2068  (http://education.[facility].com/faq/FAQ164)    HDL  Date Value Ref Range Status  11/24/2023 50 > OR = 40 mg/dL Final   Triglycerides  Date Value Ref Range  Status  11/24/2023 105 <150 mg/dL Final         Passed - ALT in normal range and within 360 days    ALT  Date Value Ref Range Status  11/24/2023 20 9 - 46 U/L Final         Passed - AST in normal range and within 360 days    AST  Date Value Ref Range Status  11/24/2023 27 10 - 35 U/L Final         Passed - Cr in normal range and within 360 days    Creat  Date Value Ref Range Status  11/24/2023 1.13 0.70 - 1.35 mg/dL Final         Passed - eGFR is 30 or above and within 360 days    GFR calc Af Amer  Date Value Ref Range Status  06/27/2018 28 (L) >60 mL/min Final    Comment:    (NOTE) The eGFR has been calculated using the CKD EPI equation. This calculation has not been validated in all clinical situations. eGFR's persistently <60 mL/min signify possible Chronic Kidney Disease.    GFR calc non Af  Amer  Date Value Ref Range Status  06/27/2018 24 (L) >60 mL/min Final   eGFR  Date Value Ref Range Status  11/24/2023 71 > OR = 60 mL/min/1.76m2 Final         Passed - Valid encounter within last 12 months    Recent Outpatient Visits           6 months ago Benign essential hypertension   [facility] Aletha Bene, MD
